# Patient Record
Sex: Male | Born: 2000 | Hispanic: Yes | Marital: Single
Health system: Southern US, Community
[De-identification: ages and names within clinical notes are randomized; demographics above are authoritative.]

---

## 2020-03-18 ENCOUNTER — Emergency Department (HOSPITAL_COMMUNITY)
Admission: EM | Admit: 2020-03-18 | Discharge: 2020-03-18 | Disposition: A | Discharge status: Home / Self Care | Payer: No Typology Code available for payment source | Attending: Emergency Medicine | Admitting: Emergency Medicine

## 2020-03-18 ENCOUNTER — Emergency Department (HOSPITAL_COMMUNITY): Payer: No Typology Code available for payment source

## 2020-03-18 DIAGNOSIS — S40011A Contusion of right shoulder, initial encounter: Secondary | ICD-10-CM | POA: Diagnosis not present

## 2020-03-18 DIAGNOSIS — S9001XA Contusion of right ankle, initial encounter: Secondary | ICD-10-CM | POA: Diagnosis not present

## 2020-03-18 DIAGNOSIS — Y9289 Other specified places as the place of occurrence of the external cause: Secondary | ICD-10-CM | POA: Insufficient documentation

## 2020-03-18 DIAGNOSIS — Y9389 Activity, other specified: Secondary | ICD-10-CM | POA: Insufficient documentation

## 2020-03-18 DIAGNOSIS — Y999 Unspecified external cause status: Secondary | ICD-10-CM | POA: Insufficient documentation

## 2020-03-18 DIAGNOSIS — S299XXA Unspecified injury of thorax, initial encounter: Secondary | ICD-10-CM | POA: Diagnosis present

## 2020-03-18 DIAGNOSIS — S20211A Contusion of right front wall of thorax, initial encounter: Secondary | ICD-10-CM | POA: Diagnosis not present

## 2020-03-18 MED ORDER — METHOCARBAMOL 500 MG PO TABS: 500.0000 mg | ORAL_TABLET | Freq: Three times a day (TID) | ORAL | 0 refills | Status: AC | PRN
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
